# Patient Record
Sex: Male | Born: 2000 | Hispanic: No | Marital: Single
Health system: Southern US, Community
[De-identification: ages and names within clinical notes are randomized; demographics above are authoritative.]

---
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
## 2021-12-03 ENCOUNTER — Encounter: Payer: Self-pay | Admitting: Family Medicine

## 2022-11-24 ENCOUNTER — Other Ambulatory Visit: Payer: Self-pay

## 2022-11-24 ENCOUNTER — Inpatient Hospital Stay (HOSPITAL_BASED_OUTPATIENT_CLINIC_OR_DEPARTMENT_OTHER)
Admission: EM | Admit: 2022-11-24 | Discharge: 2022-11-27 | DRG: 379 | Disposition: A | Discharge status: Home / Self Care | Payer: Medicaid Other | Attending: Internal Medicine | Admitting: Internal Medicine

## 2022-11-24 DIAGNOSIS — K2991 Gastroduodenitis, unspecified, with bleeding: Principal | ICD-10-CM | POA: Diagnosis present

## 2022-11-24 DIAGNOSIS — D509 Iron deficiency anemia, unspecified: Principal | ICD-10-CM

## 2022-11-24 DIAGNOSIS — Z880 Allergy status to penicillin: Secondary | ICD-10-CM

## 2022-11-24 DIAGNOSIS — D500 Iron deficiency anemia secondary to blood loss (chronic): Secondary | ICD-10-CM | POA: Diagnosis present

## 2022-11-24 DIAGNOSIS — D649 Anemia, unspecified: Secondary | ICD-10-CM | POA: Diagnosis present

## 2022-11-24 LAB — CBC WITH DIFFERENTIAL/PLATELET
Abs Immature Granulocytes: 0.01 10*3/uL (ref 0.00–0.07)
Basophils Absolute: 0 10*3/uL (ref 0.0–0.1)
Basophils Relative: 0 %
Eosinophils Absolute: 0.1 10*3/uL (ref 0.0–0.5)
Eosinophils Relative: 2 %
HCT: 22.9 % — ABNORMAL LOW (ref 39.0–52.0)
Hemoglobin: 5.8 g/dL — CL (ref 13.0–17.0)
Immature Granulocytes: 0 %
Lymphocytes Relative: 25 %
Lymphs Abs: 1.3 10*3/uL (ref 0.7–4.0)
MCH: 16.2 pg — ABNORMAL LOW (ref 26.0–34.0)
MCHC: 25.3 g/dL — ABNORMAL LOW (ref 30.0–36.0)
MCV: 64 fL — ABNORMAL LOW (ref 80.0–100.0)
Monocytes Absolute: 0.4 10*3/uL (ref 0.1–1.0)
Monocytes Relative: 7 %
Neutro Abs: 3.3 10*3/uL (ref 1.7–7.7)
Neutrophils Relative %: 66 %
Platelets: 292 10*3/uL (ref 150–400)
RBC: 3.58 MIL/uL — ABNORMAL LOW (ref 4.22–5.81)
RDW: 21.8 % — ABNORMAL HIGH (ref 11.5–15.5)
WBC: 5 10*3/uL (ref 4.0–10.5)
nRBC: 0 % (ref 0.0–0.2)

## 2022-11-24 LAB — COMPREHENSIVE METABOLIC PANEL
ALT: 8 U/L (ref 0–44)
AST: 13 U/L — ABNORMAL LOW (ref 15–41)
Albumin: 4.7 g/dL (ref 3.5–5.0)
Alkaline Phosphatase: 39 U/L (ref 38–126)
Anion gap: 10 (ref 5–15)
BUN: 14 mg/dL (ref 6–20)
CO2: 24 mmol/L (ref 22–32)
Calcium: 8.9 mg/dL (ref 8.9–10.3)
Chloride: 104 mmol/L (ref 98–111)
Creatinine, Ser: 0.89 mg/dL (ref 0.61–1.24)
GFR, Estimated: >60 mL/min (ref >60)
Glucose, Bld: 106 mg/dL — ABNORMAL HIGH (ref 70–99)
Potassium: 3.5 mmol/L (ref 3.5–5.1)
Sodium: 138 mmol/L (ref 135–145)
Total Bilirubin: 0.7 mg/dL (ref 0.3–1.2)
Total Protein: 7.2 g/dL (ref 6.5–8.1)

## 2022-11-24 LAB — FOLATE: Folate: 7.6 ng/mL (ref >5.9)

## 2022-11-24 LAB — RETICULOCYTES
Immature Retic Fract: 20.3 % — ABNORMAL HIGH (ref 2.3–15.9)
RBC.: 3.62 MIL/uL — ABNORMAL LOW (ref 4.22–5.81)
Retic Count, Absolute: 84.7 10*3/uL (ref 19.0–186.0)
Retic Ct Pct: 2.3 % (ref 0.4–3.1)

## 2022-11-24 LAB — IRON AND TIBC
Iron: 15 ug/dL — ABNORMAL LOW (ref 45–182)
Saturation Ratios: 3 % — ABNORMAL LOW (ref 17.9–39.5)
TIBC: 461 ug/dL — ABNORMAL HIGH (ref 250–450)
UIBC: 446 ug/dL

## 2022-11-24 LAB — FERRITIN: Ferritin: 2 ng/mL — ABNORMAL LOW (ref 24–336)

## 2022-11-24 LAB — ABO/RH: ABO/RH(D): O POS

## 2022-11-24 LAB — OCCULT BLOOD X 1 CARD TO LAB, STOOL: Fecal Occult Bld: NEGATIVE

## 2022-11-24 LAB — PREPARE RBC (CROSSMATCH)

## 2022-11-24 LAB — VITAMIN B12: Vitamin B-12: 274 pg/mL (ref 180–914)

## 2022-11-24 MED ORDER — ACETAMINOPHEN 650 MG RE SUPP: 650.0000 mg | Freq: Four times a day (QID) | RECTAL | Status: DC | PRN

## 2022-11-24 MED ORDER — SENNOSIDES-DOCUSATE SODIUM 8.6-50 MG PO TABS: 1.0000 | ORAL_TABLET | Freq: Every evening | ORAL | Status: DC | PRN

## 2022-11-24 MED ORDER — ONDANSETRON HCL 4 MG PO TABS: 4.0000 mg | ORAL_TABLET | Freq: Four times a day (QID) | ORAL | Status: DC | PRN

## 2022-11-24 MED ORDER — ONDANSETRON HCL 4 MG/2ML IJ SOLN: 4.0000 mg | Freq: Four times a day (QID) | INTRAMUSCULAR | Status: DC | PRN

## 2022-11-24 MED ORDER — SODIUM CHLORIDE 0.9% IV SOLUTION: Freq: Once | INTRAVENOUS | Status: AC

## 2022-11-24 MED ORDER — ACETAMINOPHEN 325 MG PO TABS: 650.0000 mg | ORAL_TABLET | Freq: Four times a day (QID) | ORAL | Status: DC | PRN

## 2022-11-24 NOTE — ED Provider Notes (Signed)
[place] EMERGENCY DEPARTMENT AT [facility] Provider Note   CSN: [id number] Arrival date & time: 11/24/22  1703     History  No chief complaint on file.   Shane Fisher is a 22 y.o. male.  HPI Patient otherwise healthy.  Has been feeling fatigued.  Since summer has had lightheadedness.  Went to new PCP today and sent in for hemoglobin of 5.9.  Denies blood in stool.  No chest pain.  States he is lost around 40 pounds.  States he has not been trying to lose weight.   No past medical history on file.  Home Medications Prior to Admission medications   Not on File      Allergies    Amoxil [amoxicillin]    Review of Systems   Review of Systems  Physical Exam Updated Vital Signs BP 136/67 (BP Location: Left Arm)   Pulse 99   Temp 99.5 F (37.5 C) (Oral)   Resp 18   Ht 5\' 7"  (1.702 m)   Wt 76.2 kg   SpO2 100%   BMI 26.31 kg/m  Physical Exam Vitals and nursing note reviewed.  Eyes:     Pupils: Pupils are equal, round, and reactive to light.  Cardiovascular:     Rate and Rhythm: Regular rhythm.     Comments: tachycardia after ambulating. Abdominal:     Palpations: There is no mass.  Musculoskeletal:        General: No tenderness.  Skin:    Coloration: Skin is pale.  Neurological:     Mental Status: He is alert and oriented to person, place, and time.     ED Results / Procedures / Treatments   Labs (all labs ordered are listed, but only abnormal results are displayed) Labs Reviewed  CBC WITH DIFFERENTIAL/PLATELET - Abnormal; Notable for the following components:      Result Value   RBC 3.58 (*)    Hemoglobin 5.8 (*)    HCT 22.9 (*)    MCV 64.0 (*)    MCH 16.2 (*)    MCHC 25.3 (*)    RDW 21.8 (*)    All other components within normal limits  IRON AND TIBC - Abnormal; Notable for the following components:   Iron 15 (*)    TIBC 461 (*)    Saturation Ratios 3 (*)    All other components within normal limits  FERRITIN - Abnormal; Notable  for the following components:   Ferritin 2 (*)    All other components within normal limits  RETICULOCYTES - Abnormal; Notable for the following components:   RBC. 3.62 (*)    Immature Retic Fract 20.3 (*)    All other components within normal limits  COMPREHENSIVE METABOLIC PANEL - Abnormal; Notable for the following components:   Glucose, Bld 106 (*)    AST 13 (*)    All other components within normal limits  VITAMIN B12  OCCULT BLOOD X 1 CARD TO LAB, STOOL  FOLATE  HIV ANTIBODY (ROUTINE TESTING W REFLEX)  CBC  BASIC METABOLIC PANEL  TYPE AND SCREEN  PREPARE RBC (CROSSMATCH)  ABO/RH    EKG None  Radiology No results found.  Procedures Procedures    Medications Ordered in ED Medications  acetaminophen (TYLENOL) tablet 650 mg (has no administration in time range)    Or  acetaminophen (TYLENOL) suppository 650 mg (has no administration in time range)  ondansetron (ZOFRAN) tablet 4 mg (has no administration in time range)    Or  ondansetron (ZOFRAN) injection 4 mg (has no administration in time range)  senna-docusate (Senokot-S) tablet 1 tablet (has no administration in time range)  0.9 %  sodium chloride infusion (Manually program via Guardrails IV Fluids) (has no administration in time range)    ED Course/ Medical Decision Making/ A&P                                 Medical Decision Making Amount and/or Complexity of Data Reviewed Labs: ordered.  Risk Decision regarding hospitalization.   Patient presents with shortness of breath and reported anemia.  Had hemoglobin of 5.9.  Differential diagnoses long but includes causes such as GI bleed.  Will check Hemoccult.  Will recheck blood work including anemia panel.  Likely require admission to hospital.  With weight loss there is worry for malignancy.  Hemoglobin is 5.8.  Microcytic and does have a low ferritin.  Most likely iron deficient.  Discussed with Dr. Cherly Hensen from oncology.  Does not need a specific  hematologic workup at this point but will need iron supplementation and likely blood.  Iron and TIBC still pending.  Hemoccult was negative.  Brown stool.  Will discuss with hospitalist for admission.  There is only emergency release O- blood here and do not think we need emergent transfusion.  CRITICAL CARE Performed by: Benjiman Core Total critical care time: 30 minutes Critical care time was exclusive of separately billable procedures and treating other patients. Critical care was necessary to treat or prevent imminent or life-threatening deterioration. Critical care was time spent personally by me on the following activities: development of treatment plan with patient and/or surrogate as well as nursing, discussions with consultants, evaluation of patient's response to treatment, examination of patient, obtaining history from patient or surrogate, ordering and performing treatments and interventions, ordering and review of laboratory studies, ordering and review of radiographic studies, pulse oximetry and re-evaluation of patient's condition.          Final Clinical Impression(s) / ED Diagnoses Final diagnoses:  Microcytic anemia    Rx / DC Orders ED Discharge Orders     None         Benjiman Core, MD 11/24/22 2318

## 2022-11-24 NOTE — H&P (Signed)
History and Physical    Shane Fisher:[id number] DOB: 2000/08/01 DOA: 11/24/2022  PCP: Berline Lopes, MD  Patient coming from: Home  I have personally briefly reviewed patient's old medical records in [facility]  Chief Complaint: Anemia  HPI: Shane Fisher is a 22 y.o. male with no significant medical history who presented to the ED for evaluation of anemia.  Patient states that since this summer he has been feeling generally fatigued.  He has had some lightheadedness but has not fallen or lost consciousness.  He states that he has been having night sweats and has had an unintentional 40 pound weight loss since summer 2023.  He was seen by his new PCP today.  Blood work was obtained and he was found to have hemoglobin of 5.9.  Patient was advised to present to the ED for further evaluation.  Patient has not seen any obvious bleeding including hematemesis, hemoptysis, hematochezia, or melena.  He does not take any blood thinners or NSAIDs.  He has no known chronic medical conditions.  He has not had any abdominal pain, nausea, vomiting, chest pain, dyspnea.  [facility] ED Course  Labs/Imaging on admission: I have personally reviewed following labs and imaging studies.  Initial vitals showed BP 115/60, pulse 82, RR 15, temp 97.9 F, SpO2 100% on room air.  Labs showed hemoglobin 5.8, hematocrit 22.9, MCV 64.0, platelets 292,000, WBC 5.0, ferritin 2, sodium 138, potassium 3.5, bicarb 24, BUN 14, creatinine 0.89, serum glucose 106, AST 13, ALT 8, alk phos 39, total bilirubin 0.7.  FOBT was negative.  B12, iron/TIBC, folate levels in process.  The hospitalist service was consulted to admit for further evaluation and management.  Review of Systems: All systems reviewed and are negative except as documented in history of present illness above.   No past medical history on file.  No past surgical history on file.  Social History:  has no history on file for  tobacco use, alcohol use, and drug use.  Allergies  Allergen Reactions   Amoxil [Amoxicillin]     No family history on file.   Prior to Admission medications   Not on File    Physical Exam: Vitals:   11/24/22 1745 11/24/22 1830 11/24/22 2000 11/24/22 2106  BP: 115/60 (!) 113/59 112/65 136/67  Pulse: 80 82 96 99  Resp: 15 18 20 18   Temp:    99.5 F (37.5 C)  TempSrc:    Oral  SpO2: 100% 100% 100% 100%  Weight:      Height:       Constitutional: Sitting up in bed.  NAD, calm, comfortable Eyes: EOMI, lids and conjunctivae normal ENMT: Mucous membranes are moist. Posterior pharynx clear of any exudate or lesions.Normal dentition.  Neck: normal, supple, no masses. Respiratory: clear to auscultation bilaterally, no wheezing, no crackles. Normal respiratory effort. No accessory muscle use.  Cardiovascular: Regular rate and rhythm, no murmurs / rubs / gallops. No extremity edema. 2+ pedal pulses. Abdomen: no tenderness, no masses palpated.  Musculoskeletal: no clubbing / cyanosis. No joint deformity upper and lower extremities. Good ROM, no contractures. Normal muscle tone.  Skin: no rashes, lesions, ulcers. No induration Neurologic: Sensation intact. Strength 5/5 in all 4.  Psychiatric: Normal judgment and insight. Alert and oriented x 3. Normal mood.   EKG: Not performed. Assessment/Plan Principal Problem:   Symptomatic anemia Active Problems:   Iron deficiency anemia   Shane Fisher is a 22 y.o. male with no significant medical  history who is admitted with symptomatic anemia.  Assessment and Plan: Symptomatic anemia with iron deficiency: Presenting with Hgb 5.8, ferritin 2.  Patient has not seen any obvious bleeding, FOBT was negative in the ED.  However he does report an unintentional 40 pound weight loss since summer 2023 as well as night sweats concerning for potential malignancy. -Type and screen -Transfuse 1 unit PRBC -Follow B12, folate, iron/TIBC levels -Repeat  CBC posttransfusion -Consider iron transfusion while in hospital -Secure chat sent to [facility] Dr. Bosie Clos who will notify daytime team for consult tomorrow   DVT prophylaxis: SCDs Start: 11/24/22 2117 Code Status: Full code Family Communication: Parents and sister at bedside Disposition Plan: From home and likely discharge to home pending clinical progress Consults called: None Severity of Illness: The appropriate patient status for this patient is OBSERVATION. Observation status is judged to be reasonable and necessary in order to provide the required intensity of service to ensure the patient's safety. The patient's presenting symptoms, physical exam findings, and initial radiographic and laboratory data in the context of their medical condition is felt to place them at decreased risk for further clinical deterioration. Furthermore, it is anticipated that the patient will be medically stable for discharge from the hospital within 2 midnights of admission.   Darreld Mclean MD [facility]  If 7PM-7AM, please contact night-coverage [email or web address]  11/24/2022, 9:37 PM

## 2022-11-24 NOTE — ED Triage Notes (Signed)
Seen at PCP this AM for check-up. Found to have HGB 5.9- sent here. 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 Denies obvious routes of bleeding- unsure of loss. Ambulatory to triage, A+Ox4, pale.   No daily meds.

## 2022-11-24 NOTE — Hospital Course (Signed)
Shane Fisher is a 22 y.o. male with no significant medical history who is admitted with symptomatic anemia.

## 2022-11-24 NOTE — Progress Notes (Signed)
Plan of Care Note for accepted transfer   Patient: Shane Fisher MRN: [id number]   DOA: 11/24/2022  Facility requesting transfer: [facility]   Requesting Provider: Dr. Rubin Payor   Reason for transfer: Symptomatic anemia   Facility course: 22 year old gentleman who denies any significant past medical history presents with progressive fatigue and unintentional weight loss over the course of months.  He is afebrile with stable vitals.  Labs are most notable for hemoglobin 5.8 with MCV 64.  WBC, platelet count, and bilirubin are normal and FOBT is negative.  Ferritin is 2 and remainder of anemia panel is pending.  Plan of care: The patient is accepted for admission to Med-surg  unit, at [facility].   Author: Briscoe Deutscher, MD 11/24/2022  Check [email or web address] for on-call coverage.  Nursing staff, Please call [facility] Admits & Consults System-Wide number on Amion as soon as patient's arrival, so appropriate admitting provider can evaluate the pt.

## 2022-11-25 ENCOUNTER — Encounter (HOSPITAL_COMMUNITY): Payer: Self-pay | Admitting: Family Medicine

## 2022-11-25 ENCOUNTER — Inpatient Hospital Stay (HOSPITAL_COMMUNITY): Payer: Medicaid Other

## 2022-11-25 DIAGNOSIS — D500 Iron deficiency anemia secondary to blood loss (chronic): Secondary | ICD-10-CM | POA: Diagnosis present

## 2022-11-25 DIAGNOSIS — Z880 Allergy status to penicillin: Secondary | ICD-10-CM | POA: Diagnosis not present

## 2022-11-25 DIAGNOSIS — D509 Iron deficiency anemia, unspecified: Secondary | ICD-10-CM | POA: Diagnosis present

## 2022-11-25 DIAGNOSIS — K2991 Gastroduodenitis, unspecified, with bleeding: Secondary | ICD-10-CM | POA: Diagnosis present

## 2022-11-25 LAB — CBC
HCT: 25.1 % — ABNORMAL LOW (ref 39.0–52.0)
Hemoglobin: 6.7 g/dL — CL (ref 13.0–17.0)
MCH: 18.2 pg — ABNORMAL LOW (ref 26.0–34.0)
MCHC: 26.7 g/dL — ABNORMAL LOW (ref 30.0–36.0)
MCV: 68.2 fL — ABNORMAL LOW (ref 80.0–100.0)
Platelets: 268 10*3/uL (ref 150–400)
RBC: 3.68 MIL/uL — ABNORMAL LOW (ref 4.22–5.81)
RDW: 24.6 % — ABNORMAL HIGH (ref 11.5–15.5)
WBC: 4.9 10*3/uL (ref 4.0–10.5)
nRBC: 0 % (ref 0.0–0.2)

## 2022-11-25 LAB — HEMOGLOBIN AND HEMATOCRIT, BLOOD
HCT: 30.1 % — ABNORMAL LOW (ref 39.0–52.0)
Hemoglobin: 8.2 g/dL — ABNORMAL LOW (ref 13.0–17.0)

## 2022-11-25 LAB — HIV ANTIBODY (ROUTINE TESTING W REFLEX): HIV Screen 4th Generation wRfx: NONREACTIVE

## 2022-11-25 LAB — BASIC METABOLIC PANEL
Anion gap: 7 (ref 5–15)
BUN: 13 mg/dL (ref 6–20)
CO2: 27 mmol/L (ref 22–32)
Calcium: 8.8 mg/dL — ABNORMAL LOW (ref 8.9–10.3)
Chloride: 104 mmol/L (ref 98–111)
Creatinine, Ser: 0.83 mg/dL (ref 0.61–1.24)
GFR, Estimated: >60 mL/min (ref >60)
Glucose, Bld: 103 mg/dL — ABNORMAL HIGH (ref 70–99)
Potassium: 3.8 mmol/L (ref 3.5–5.1)
Sodium: 138 mmol/L (ref 135–145)

## 2022-11-25 LAB — PREPARE RBC (CROSSMATCH)

## 2022-11-25 MED ORDER — SODIUM CHLORIDE 0.9% IV SOLUTION: Freq: Once | INTRAVENOUS | Status: AC

## 2022-11-25 MED ORDER — SODIUM CHLORIDE 0.9% IV SOLUTION: Freq: Once | INTRAVENOUS | Status: DC

## 2022-11-25 MED ORDER — IOHEXOL 9 MG/ML PO SOLN
500.0000 mL | ORAL | Status: AC
  Administered 2022-11-25 (×2): 500 mL via ORAL

## 2022-11-25 MED ORDER — IOHEXOL 300 MG/ML  SOLN
100.0000 mL | Freq: Once | INTRAMUSCULAR | Status: AC | PRN
  Administered 2022-11-25: 100 mL via INTRAVENOUS

## 2022-11-25 MED ORDER — SODIUM CHLORIDE 0.9 % IV SOLN
100.0000 mg | Freq: Once | INTRAVENOUS | Status: AC
  Administered 2022-11-25: 100 mg via INTRAVENOUS
  Filled 2022-11-25: qty 5

## 2022-11-25 NOTE — Consult Note (Signed)
[facility] Gastroenterology Consult  Referring Provider: No ref. provider found Primary Care Physician:  Berline Lopes, MD Primary Gastroenterologist: Gentry Fitz  Reason for Consultation: Profound anemia  SUBJECTIVE:   HPI: Shane Fisher is a 22 y.o. male with no significant medical history presented to hospital after evaluation by PCP showed anemia. He noted that he has lost 40 lb unintentionally since January 2024. He has been experiencing reduced appetite and some nausea with intermittent vomiting. No constipation or diarrhea. No joint pains. No chest pain or shortness of breath. No prior surgeries. No family history of GI concerns. Denied alcohol use, tobacco use, marijuana use.   Hgb on presentation 5.8, now 6.7, PLT 268, WBC 4.9. Iron deficient. BUN/Cr 13/0.83, Na 138, K 3.8, AST/ALT 13/8, ALP 39, Tb 0.7. Fecal occult blood testing negative. No imaging obtained.   History reviewed. No pertinent past medical history. History reviewed. No pertinent surgical history. Prior to Admission medications   Not on File   Current Facility-Administered Medications  Medication Dose Route Frequency Provider Last Rate Last Admin   0.9 %  sodium chloride infusion (Manually program via Guardrails IV Fluids)   Intravenous Once Dorcas Carrow, MD       acetaminophen (TYLENOL) tablet 650 mg  650 mg Oral Q6H PRN Charlsie Quest, MD       Or   acetaminophen (TYLENOL) suppository 650 mg  650 mg Rectal Q6H PRN Charlsie Quest, MD       ondansetron (ZOFRAN) tablet 4 mg  4 mg Oral Q6H PRN Charlsie Quest, MD       Or   ondansetron (ZOFRAN) injection 4 mg  4 mg Intravenous Q6H PRN Charlsie Quest, MD       senna-docusate (Senokot-S) tablet 1 tablet  1 tablet Oral QHS PRN Charlsie Quest, MD       Allergies as of 11/24/2022 - Reviewed 11/24/2022  Allergen Reaction Noted   Amoxil [amoxicillin]  07/24/2014   History reviewed. No pertinent family history. Social History   Socioeconomic History   Marital  status: Single    Spouse name: Not on file   Number of children: Not on file   Years of education: Not on file   Highest education level: Not on file  Occupational History   Not on file  Tobacco Use   Smoking status: Unknown   Smokeless tobacco: Not on file  Substance and Sexual Activity   Alcohol use: Not Currently   Drug use: Not Currently   Sexual activity: Not Currently  Other Topics Concern   Not on file  Social History Narrative   Not on file   Social Determinants of Health   Financial Resource Strain: Not on file  Food Insecurity: No Food Insecurity (11/24/2022)   Hunger Vital Sign    Worried About Running Out of Food in the Last Year: Never true    Ran Out of Food in the Last Year: Never true  Transportation Needs: No Transportation Needs (11/24/2022)   PRAPARE - Administrator, Civil Service (Medical): No    Lack of Transportation (Non-Medical): No  Physical Activity: Not on file  Stress: Not on file  Social Connections: Not on file  Intimate Partner Violence: Not At Risk (11/24/2022)   Humiliation, Afraid, Rape, and Kick questionnaire    Fear of Current or Ex-Partner: No    Emotionally Abused: No    Physically Abused: No    Sexually Abused: No   Review of Systems:  Review  of Systems  Constitutional:  Positive for malaise/fatigue and weight loss.  Respiratory:  Negative for shortness of breath.   Cardiovascular:  Negative for chest pain.  Gastrointestinal:  Positive for nausea and vomiting. Negative for abdominal pain, blood in stool, constipation and diarrhea.    OBJECTIVE:   Temp:  [97.9 F (36.6 C)-99.5 F (37.5 C)] 98.5 F (36.9 C) (10/15 1038) Pulse Rate:  [77-99] 77 (10/15 1038) Resp:  [15-20] 20 (10/15 1038) BP: (105-136)/(48-67) 115/57 (10/15 1038) SpO2:  [100 %] 100 % (10/15 1038) Weight:  [76.2 kg] 76.2 kg (10/14 1722) Last BM Date : 11/24/22 Physical Exam Constitutional:      General: He is not in acute distress.     Appearance: He is not ill-appearing, toxic-appearing or diaphoretic.  Cardiovascular:     Rate and Rhythm: Normal rate and regular rhythm.  Pulmonary:     Effort: No respiratory distress.     Breath sounds: Normal breath sounds.  Abdominal:     General: Bowel sounds are normal. There is no distension.     Palpations: Abdomen is soft.     Tenderness: There is no abdominal tenderness. There is no guarding.  Musculoskeletal:     Right lower leg: No edema.     Left lower leg: No edema.  Skin:    General: Skin is warm and dry.  Neurological:     Mental Status: He is alert.     Labs: Recent Labs    11/24/22 1720 11/25/22 0539  WBC 5.0 4.9  HGB 5.8* 6.7*  HCT 22.9* 25.1*  PLT 292 268   BMET Recent Labs    11/24/22 1730 11/25/22 0539  NA 138 138  K 3.5 3.8  CL 104 104  CO2 24 27  GLUCOSE 106* 103*  BUN 14 13  CREATININE 0.89 0.83  CALCIUM 8.9 8.8*   LFT Recent Labs    11/24/22 1730  PROT 7.2  ALBUMIN 4.7  AST 13*  ALT 8  ALKPHOS 39  BILITOT 0.7   PT/INR No results for input(s): "LABPROT", "INR" in the last 72 hours.  Diagnostic imaging: No results found.  IMPRESSION: Iron deficiency anemia  -Fecal occult negative stool Unintentional weight loss Nausea, intermittent emesis  PLAN: -Recommend eventual EGD and colonoscopy to evaluate profound anemia, unintentional weight loss, nausea and vomiting, discussed risks of bleeding/infection/perforation/missed lesion/anesthesia, he verbalized understanding and elected to proceed -No availability to complete procedures until 11/27/22, patient agreeable to staying inpatient for procedures -Would recommend CT imaging of chest/abdomen and pelvis while waiting for procedures -Ok for regular diet today, clear liquid diet tomorrow and bowel preparation tomorrow -Trend H/H, transfuse for Hgb < 7  -[facility] will follow   LOS: 0 days   Liliane Shi, DO [facility]enterology

## 2022-11-25 NOTE — Progress Notes (Signed)
PROGRESS NOTE    Shane Fisher  ZOX:[id number] DOB: 03/12/2000 DOA: 11/24/2022 PCP: Berline Lopes, MD    Brief Narrative:  22 year old with no significant medical history presented with feeling fatigue and occasional lightheadedness at least since last few months, 40 pound weight loss since summer 2023.  Does have occasional night sweats but no other symptoms.  No clinical history of bleeding anywhere.  No baseline labs available in the past.  Admitted with hemoglobin of 5.9 otherwise medically stable. Born and raised in [place]. He had a short visit to [place] in December 2023, denies any signs of infection since then.   Assessment & Plan:   Acute on chronic iron deficiency anemia, symptomatic anemia: Patient with night sweats, weight loss, fatigue and weakness.  Hemoglobin 5.9.  Ferritin 2.  B12 and folic acid normal. Receiving second unit of PRBC today, will also transfuse IV iron.  Currently no evidence of active bleeding. GI consulted, anticipating EGD and colonoscopy.  If unrevealing, patient will need CT scan of the chest abdomen pelvis with contrast to rule out any occult malignancy.  Will hold off oral contrast before endoscopies.     DVT prophylaxis: SCDs Start: 11/24/22 2117   Code Status: Full code Family Communication: Father at the bedside Disposition Plan: Status is: Observation The patient will require care spanning > 2 midnights and should be moved to inpatient because: Significant low hemoglobin, blood transfusions and inpatient procedures     Consultants:  Gastroenterology  Procedures:  None  Antimicrobials:  None   Subjective: Patient seen and examined.  No overnight events.  Patient himself denies any complaints.  Tolerating blood transfusion without any issues.  Father at the bedside.  Hungry.  Objective: Vitals:   11/25/22 0643 11/25/22 0805 11/25/22 0826 11/25/22 1038  BP: 110/64 (!) 121/53 (!) 105/48 (!) 115/57  Pulse: 97 82 77 77   Resp: 18 20 20 20   Temp: 98.5 F (36.9 C) 98.1 F (36.7 C) 98.1 F (36.7 C) 98.5 F (36.9 C)  TempSrc: Oral Oral Oral Oral  SpO2: 100% 100% 100% 100%  Weight:      Height:        Intake/Output Summary (Last 24 hours) at 11/25/2022 1115 Last data filed at 11/25/2022 1037 Gross per 24 hour  Intake 964.5 ml  Output --  Net 964.5 ml   Filed Weights   11/24/22 1722  Weight: 76.2 kg    Examination:  General exam: Appears calm and comfortable . Pale  Respiratory system: Clear to auscultation. Respiratory effort normal. Cardiovascular system: S1 & S2 heard, RRR. No JVD, murmurs, rubs, gallops or clicks. No pedal edema. Gastrointestinal system: Abdomen is nondistended, soft and nontender. No organomegaly or masses felt. Normal bowel sounds heard. Central nervous system: Alert and oriented. No focal neurological deficits. Extremities: Symmetric 5 x 5 power. Skin: No rashes, lesions or ulcers Psychiatry: Judgement and insight appear normal. Mood & affect appropriate.     Data Reviewed: I have personally reviewed following labs and imaging studies  CBC: Recent Labs  Lab 11/24/22 1720 11/25/22 0539  WBC 5.0 4.9  NEUTROABS 3.3  --   HGB 5.8* 6.7*  HCT 22.9* 25.1*  MCV 64.0* 68.2*  PLT 292 268   Basic Metabolic Panel: Recent Labs  Lab 11/24/22 1730 11/25/22 0539  NA 138 138  K 3.5 3.8  CL 104 104  CO2 24 27  GLUCOSE 106* 103*  BUN 14 13  CREATININE 0.89 0.83  CALCIUM 8.9 8.8*  GFR: Estimated Creatinine Clearance: 130.5 mL/min (by C-G formula based on SCr of 0.83 mg/dL). Liver Function Tests: Recent Labs  Lab 11/24/22 1730  AST 13*  ALT 8  ALKPHOS 39  BILITOT 0.7  PROT 7.2  ALBUMIN 4.7   No results for input(s): "LIPASE", "AMYLASE" in the last 168 hours. No results for input(s): "AMMONIA" in the last 168 hours. Coagulation Profile: No results for input(s): "INR", "PROTIME" in the last 168 hours. Cardiac Enzymes: No results for input(s): "CKTOTAL",  "CKMB", "CKMBINDEX", "TROPONINI" in the last 168 hours. BNP (last 3 results) No results for input(s): "PROBNP" in the last 8760 hours. HbA1C: No results for input(s): "HGBA1C" in the last 72 hours. CBG: No results for input(s): "GLUCAP" in the last 168 hours. Lipid Profile: No results for input(s): "CHOL", "HDL", "LDLCALC", "TRIG", "CHOLHDL", "LDLDIRECT" in the last 72 hours. Thyroid Function Tests: No results for input(s): "TSH", "T4TOTAL", "FREET4", "T3FREE", "THYROIDAB" in the last 72 hours. Anemia Panel: Recent Labs    11/24/22 1720 11/24/22 1730  VITAMINB12  --  274  FOLATE  --  7.6  FERRITIN  --  2*  TIBC 461*  --   IRON 15*  --   RETICCTPCT  --  2.3   Sepsis Labs: No results for input(s): "PROCALCITON", "LATICACIDVEN" in the last 168 hours.  No results found for this or any previous visit (from the past 240 hour(s)).       Radiology Studies: No results found.      Scheduled Meds:  sodium chloride   Intravenous Once   Continuous Infusions:   LOS: 0 days    Time spent: 35 minutes    Dorcas Carrow, MD [facility]

## 2022-11-26 DIAGNOSIS — D649 Anemia, unspecified: Secondary | ICD-10-CM | POA: Diagnosis present

## 2022-11-26 DIAGNOSIS — D500 Iron deficiency anemia secondary to blood loss (chronic): Secondary | ICD-10-CM

## 2022-11-26 LAB — CBC
HCT: 29.7 % — ABNORMAL LOW (ref 39.0–52.0)
Hemoglobin: 8 g/dL — ABNORMAL LOW (ref 13.0–17.0)
MCH: 19 pg — ABNORMAL LOW (ref 26.0–34.0)
MCHC: 26.9 g/dL — ABNORMAL LOW (ref 30.0–36.0)
MCV: 70.7 fL — ABNORMAL LOW (ref 80.0–100.0)
Platelets: 262 10*3/uL (ref 150–400)
RBC: 4.2 MIL/uL — ABNORMAL LOW (ref 4.22–5.81)
RDW: 25 % — ABNORMAL HIGH (ref 11.5–15.5)
WBC: 4 10*3/uL (ref 4.0–10.5)
nRBC: 0 % (ref 0.0–0.2)

## 2022-11-26 LAB — TYPE AND SCREEN
ABO/RH(D): O POS
Antibody Screen: NEGATIVE
Unit division: 0
Unit division: 0

## 2022-11-26 LAB — BPAM RBC
Blood Product Expiration Date: 202411122359
Blood Product Expiration Date: 202411142359
ISSUE DATE / TIME: 202410142351
ISSUE DATE / TIME: 202410150805
Unit Type and Rh: 5100
Unit Type and Rh: 5100

## 2022-11-26 LAB — PROTIME-INR
INR: 1.2 (ref 0.8–1.2)
Prothrombin Time: 15.2 s (ref 11.4–15.2)

## 2022-11-26 LAB — APTT: aPTT: 29 s (ref 24–36)

## 2022-11-26 MED ORDER — BISACODYL 5 MG PO TBEC
5.0000 mg | DELAYED_RELEASE_TABLET | Freq: Once | ORAL | Status: AC
  Administered 2022-11-26: 5 mg via ORAL
  Filled 2022-11-26: qty 1

## 2022-11-26 MED ORDER — PEG 3350-KCL-NA BICARB-NACL 420 G PO SOLR
4000.0000 mL | Freq: Once | ORAL | Status: AC
  Administered 2022-11-26: 4000 mL via ORAL
  Filled 2022-11-26: qty 4000

## 2022-11-26 NOTE — Progress Notes (Signed)
PROGRESS NOTE   Shane Fisher  MWU:[id number] DOB: 2000/05/26 DOA: 11/24/2022 PCP: Berline Lopes, MD   Date of Service: the patient was seen and examined on 11/26/2022  Brief Narrative:  Shane Fisher is a 22 y.o. male with no significant medical history who is admitted with complaints of fatigue and lightheadedness found to have a hemoglobin of 5.9 by their primary care provider sent to [facility] hospital emergency department for further evaluation.  Upon evaluation in the emergency department confirmation blood testing revealed a hemoglobin of 5.8.  Patient was hemodynamically stable.  Patient was accepted for transfer to [facility] for further medical care.  Upon arrival to [facility] 1 unit packed blood cell transfusion was initiated.  Secure chat consultation request was sent to gastroenterology to request their assistance which is greatly appreciated.   Assessment and Plan: * Chronic blood loss anemia Minimal symptoms suggests chronic development of severe anemia Hemoglobin has improved substantially, up to most recent value of 8.0 from 5.8 on arrival after only 1 unit of packed red blood cells transfused. Continuing monitor hemoglobin and hematocrit with serial CBCs. [facility] following, their input is appreciated Patient to undergo upper and lower endoscopic workup on 10/17 Bowel prep and clear liquid diet today with n.p.o. after midnight.  Symptomatic anemia See assessment and plan above  Iron deficiency anemia due to chronic blood loss Iron panel consistent with iron deficiency anemia further suggesting that chronic blood loss is the cause of the patient's presentation. Folate and vitamin B12 levels unremarkable    Subjective:  Patient states that his generalized weakness and lightheadedness have substantially improved since arrival.  Patient states that his symptoms have improved status post transfusion.  Patient denies any abdominal pain  Tally blood in his stool or melena today.  Physical Exam:  Vitals:   11/25/22 1441 11/25/22 2041 11/26/22 0556 11/26/22 1307  BP: (!) 106/50 (!) 121/58 (!) 102/55 122/61  Pulse: 73 80 88 79  Resp: 18 18 14 16   Temp: (!) 97.5 F (36.4 C) 98.3 F (36.8 C) 98.1 F (36.7 C) 98.3 F (36.8 C)  TempSrc: Oral Oral Oral Oral  SpO2: 100% 100% 100% 100%  Weight:      Height:        Constitutional: Awake alert and oriented x3, no associated distress.   Skin: no rashes, no lesions, good skin turgor noted. Eyes: Pupils are equally reactive to light.  No evidence of scleral icterus or conjunctival pallor.  ENMT: Moist mucous membranes noted.  Posterior pharynx clear of any exudate or lesions.   Respiratory: clear to auscultation bilaterally, no wheezing, no crackles. Normal respiratory effort. No accessory muscle use.  Cardiovascular: Regular rate and rhythm, no murmurs / rubs / gallops. No extremity edema. 2+ pedal pulses. No carotid bruits.  Abdomen: Abdomen is soft and nontender.  No evidence of intra-abdominal masses.  Positive bowel sounds noted in all quadrants.   Musculoskeletal: No joint deformity upper and lower extremities. Good ROM, no contractures. Normal muscle tone.    Data Reviewed:  I have personally reviewed and interpreted labs, imaging.  Significant findings are   CBC: Recent Labs  Lab 11/24/22 1720 11/25/22 0539 11/25/22 1505 11/26/22 1346  WBC 5.0 4.9  --  4.0  NEUTROABS 3.3  --   --   --   HGB 5.8* 6.7* 8.2* 8.0*  HCT 22.9* 25.1* 30.1* 29.7*  MCV 64.0* 68.2*  --  70.7*  PLT 292 268  --  262  Basic Metabolic Panel: Recent Labs  Lab 11/24/22 1730 11/25/22 0539  NA 138 138  K 3.5 3.8  CL 104 104  CO2 24 27  GLUCOSE 106* 103*  BUN 14 13  CREATININE 0.89 0.83  CALCIUM 8.9 8.8*   GFR: Estimated Creatinine Clearance: 130.5 mL/min (by C-G formula based on SCr of 0.83 mg/dL). Liver Function Tests: Recent Labs  Lab 11/24/22 1730  AST 13*  ALT 8   ALKPHOS 39  BILITOT 0.7  PROT 7.2  ALBUMIN 4.7    Coagulation Profile: Recent Labs  Lab 11/26/22 1346  INR 1.2     Code Status:  Full code.  Code status decision has been confirmed with: patient Family Communication: Mother at the bedside who has been updated on plan of care.   Severity of Illness:  The appropriate patient status for this patient is INPATIENT. Inpatient status is judged to be reasonable and necessary in order to provide the required intensity of service to ensure the patient's safety. The patient's presenting symptoms, physical exam findings, and initial radiographic and laboratory data in the context of their chronic comorbidities is felt to place them at high risk for further clinical deterioration. Furthermore, it is not anticipated that the patient will be medically stable for discharge from the hospital within 2 midnights of admission.   * I certify that at the point of admission it is my clinical judgment that the patient will require inpatient hospital care spanning beyond 2 midnights from the point of admission due to high intensity of service, high risk for further deterioration and high frequency of surveillance required.*  Time spent:  50 minutes  Author:  Marinda Elk MD  11/26/2022 7:22 PM

## 2022-11-26 NOTE — Assessment & Plan Note (Signed)
Iron panel consistent with iron deficiency anemia further suggesting that chronic blood loss is the cause of the patient's presentation. Folate and vitamin B12 levels unremarkable

## 2022-11-26 NOTE — H&P (View-Only) (Signed)
[facility] Progress Note  SUBJECTIVE:   Interval history: Shane Fisher was seen and evaluated today at bedside.  Resting comfortably in bed, mother at bedside.  Denied abdominal pain.  No chest pain or shortness of breath.  No recent bowel movement.  Agreeable to EGD and colonoscopy tomorrow.  History reviewed. No pertinent past medical history. History reviewed. No pertinent surgical history. Current Facility-Administered Medications  Medication Dose Route Frequency Provider Last Rate Last Admin   0.9 %  sodium chloride infusion (Manually program via Guardrails IV Fluids)   Intravenous Once Dorcas Carrow, MD       acetaminophen (TYLENOL) tablet 650 mg  650 mg Oral Q6H PRN Charlsie Quest, MD       Or   acetaminophen (TYLENOL) suppository 650 mg  650 mg Rectal Q6H PRN Charlsie Quest, MD       bisacodyl (DULCOLAX) EC tablet 5 mg  5 mg Oral Once Liliane Shi H, DO       ondansetron [facility]) tablet 4 mg  4 mg Oral Q6H PRN Charlsie Quest, MD       Or   ondansetron (ZOFRAN) injection 4 mg  4 mg Intravenous Q6H PRN Charlsie Quest, MD       polyethylene glycol-electrolytes (NuLYTELY) solution 4,000 mL  4,000 mL Oral Once Lynann Bologna, DO       senna-docusate (Senokot-S) tablet 1 tablet  1 tablet Oral QHS PRN Charlsie Quest, MD       Allergies as of 11/24/2022 - Reviewed 11/24/2022  Allergen Reaction Noted   Amoxil [amoxicillin]  07/24/2014   Review of Systems:  Review of Systems  Respiratory:  Negative for shortness of breath.   Cardiovascular:  Negative for chest pain.  Gastrointestinal:  Negative for abdominal pain, constipation, diarrhea, nausea and vomiting.    OBJECTIVE:   Temp:  [97.5 F (36.4 C)-98.3 F (36.8 C)] 98.1 F (36.7 C) (10/16 0556) Pulse Rate:  [73-88] 88 (10/16 0556) Resp:  [14-18] 14 (10/16 0556) BP: (102-121)/(50-58) 102/55 (10/16 0556) SpO2:  [100 %] 100 % (10/16 0556) Last BM Date : 11/24/22 Physical Exam Constitutional:       General: He is not in acute distress.    Appearance: He is not ill-appearing, toxic-appearing or diaphoretic.  Cardiovascular:     Rate and Rhythm: Normal rate and regular rhythm.  Pulmonary:     Effort: No respiratory distress.     Breath sounds: Normal breath sounds.  Abdominal:     General: Bowel sounds are normal. There is no distension.     Palpations: Abdomen is soft.     Tenderness: There is no abdominal tenderness. There is no guarding.  Musculoskeletal:     Right lower leg: No edema.     Left lower leg: No edema.  Neurological:     Mental Status: He is alert.     Labs: Recent Labs    11/24/22 1720 11/25/22 0539 11/25/22 1505  WBC 5.0 4.9  --   HGB 5.8* 6.7* 8.2*  HCT 22.9* 25.1* 30.1*  PLT 292 268  --    BMET Recent Labs    11/24/22 1730 11/25/22 0539  NA 138 138  K 3.5 3.8  CL 104 104  CO2 24 27  GLUCOSE 106* 103*  BUN 14 13  CREATININE 0.89 0.83  CALCIUM 8.9 8.8*   LFT Recent Labs    11/24/22 1730  PROT 7.2  ALBUMIN 4.7  AST 13*  ALT 8  ALKPHOS 39  BILITOT 0.7   PT/INR No results for input(s): "LABPROT", "INR" in the last 72 hours. Diagnostic imaging: CT CHEST ABDOMEN PELVIS W CONTRAST  Result Date: 11/25/2022 CLINICAL DATA:  40 lb weight loss, anemia, loss of appetite. Evaluate for occult malignancy. EXAM: CT CHEST, ABDOMEN, AND PELVIS WITH CONTRAST TECHNIQUE: Multidetector CT imaging of the chest, abdomen and pelvis was performed following the standard protocol during bolus administration of intravenous contrast. RADIATION DOSE REDUCTION: This exam was performed according to the departmental dose-optimization program which includes automated exposure control, adjustment of the mA and/or kV according to patient size and/or use of iterative reconstruction technique. CONTRAST:  OMNIPAQUE IOHEXOL 300 MG/ML  SOLN COMPARISON:  None Available. FINDINGS: CT CHEST FINDINGS Cardiovascular: Heart is normal size. Aorta is normal caliber.  Mediastinum/Nodes: No mediastinal, hilar, or axillary adenopathy. Trachea and esophagus are unremarkable. Thyroid unremarkable. Lungs/Pleura: Lungs are clear. No focal airspace opacities or suspicious nodules. No effusions. Musculoskeletal: Chest wall soft tissues are unremarkable. No acute bony abnormality. CT ABDOMEN PELVIS FINDINGS Hepatobiliary: No focal hepatic abnormality. Gallbladder unremarkable. Pancreas: No focal abnormality or ductal dilatation. Spleen: No focal abnormality.  Normal size. Adrenals/Urinary Tract: No adrenal abnormality. No focal renal abnormality. No stones or hydronephrosis. Urinary bladder is unremarkable. Stomach/Bowel: Stomach, large and small bowel grossly unremarkable. Normal appendix. Vascular/Lymphatic: No evidence of aneurysm or adenopathy. Reproductive: No visible focal abnormality. Other: No free fluid or free air. Musculoskeletal: No acute bony abnormality. IMPRESSION: No acute findings or significant abnormality in the chest, abdomen or pelvis. Electronically Signed   By: Charlett Nose M.D.   On: 11/25/2022 20:22    IMPRESSION: Iron deficiency anemia             -Fecal occult negative stool Unintentional weight loss Nausea, intermittent emesis, no current  PLAN: -Clear liquid diet today, bowel preparation this afternoon, n.p.o. at midnight for EGD and colonoscopy tomorrow -Trend H/H, transfuse for hemoglobin less than 7 -Further recommendations to follow pending procedures tomorrow   LOS: 1 day   Liliane Shi, [facility] G[facility]enterology

## 2022-11-26 NOTE — Progress Notes (Signed)
[facility] Progress Note  SUBJECTIVE:   Interval history: Shane Fisher was seen and evaluated today at bedside.  Resting comfortably in bed, mother at bedside.  Denied abdominal pain.  No chest pain or shortness of breath.  No recent bowel movement.  Agreeable to EGD and colonoscopy tomorrow.  History reviewed. No pertinent past medical history. History reviewed. No pertinent surgical history. Current Facility-Administered Medications  Medication Dose Route Frequency Provider Last Rate Last Admin   0.9 %  sodium chloride infusion (Manually program via Guardrails IV Fluids)   Intravenous Once Dorcas Carrow, MD       acetaminophen (TYLENOL) tablet 650 mg  650 mg Oral Q6H PRN Charlsie Quest, MD       Or   acetaminophen (TYLENOL) suppository 650 mg  650 mg Rectal Q6H PRN Charlsie Quest, MD       bisacodyl (DULCOLAX) EC tablet 5 mg  5 mg Oral Once Liliane Shi H, DO       ondansetron [facility]) tablet 4 mg  4 mg Oral Q6H PRN Charlsie Quest, MD       Or   ondansetron (ZOFRAN) injection 4 mg  4 mg Intravenous Q6H PRN Charlsie Quest, MD       polyethylene glycol-electrolytes (NuLYTELY) solution 4,000 mL  4,000 mL Oral Once Lynann Bologna, DO       senna-docusate (Senokot-S) tablet 1 tablet  1 tablet Oral QHS PRN Charlsie Quest, MD       Allergies as of 11/24/2022 - Reviewed 11/24/2022  Allergen Reaction Noted   Amoxil [amoxicillin]  07/24/2014   Review of Systems:  Review of Systems  Respiratory:  Negative for shortness of breath.   Cardiovascular:  Negative for chest pain.  Gastrointestinal:  Negative for abdominal pain, constipation, diarrhea, nausea and vomiting.    OBJECTIVE:   Temp:  [97.5 F (36.4 C)-98.3 F (36.8 C)] 98.1 F (36.7 C) (10/16 0556) Pulse Rate:  [73-88] 88 (10/16 0556) Resp:  [14-18] 14 (10/16 0556) BP: (102-121)/(50-58) 102/55 (10/16 0556) SpO2:  [100 %] 100 % (10/16 0556) Last BM Date : 11/24/22 Physical Exam Constitutional:       General: He is not in acute distress.    Appearance: He is not ill-appearing, toxic-appearing or diaphoretic.  Cardiovascular:     Rate and Rhythm: Normal rate and regular rhythm.  Pulmonary:     Effort: No respiratory distress.     Breath sounds: Normal breath sounds.  Abdominal:     General: Bowel sounds are normal. There is no distension.     Palpations: Abdomen is soft.     Tenderness: There is no abdominal tenderness. There is no guarding.  Musculoskeletal:     Right lower leg: No edema.     Left lower leg: No edema.  Neurological:     Mental Status: He is alert.     Labs: Recent Labs    11/24/22 1720 11/25/22 0539 11/25/22 1505  WBC 5.0 4.9  --   HGB 5.8* 6.7* 8.2*  HCT 22.9* 25.1* 30.1*  PLT 292 268  --    BMET Recent Labs    11/24/22 1730 11/25/22 0539  NA 138 138  K 3.5 3.8  CL 104 104  CO2 24 27  GLUCOSE 106* 103*  BUN 14 13  CREATININE 0.89 0.83  CALCIUM 8.9 8.8*   LFT Recent Labs    11/24/22 1730  PROT 7.2  ALBUMIN 4.7  AST 13*  ALT 8  ALKPHOS 39  BILITOT 0.7   PT/INR No results for input(s): "LABPROT", "INR" in the last 72 hours. Diagnostic imaging: CT CHEST ABDOMEN PELVIS W CONTRAST  Result Date: 11/25/2022 CLINICAL DATA:  40 lb weight loss, anemia, loss of appetite. Evaluate for occult malignancy. EXAM: CT CHEST, ABDOMEN, AND PELVIS WITH CONTRAST TECHNIQUE: Multidetector CT imaging of the chest, abdomen and pelvis was performed following the standard protocol during bolus administration of intravenous contrast. RADIATION DOSE REDUCTION: This exam was performed according to the departmental dose-optimization program which includes automated exposure control, adjustment of the mA and/or kV according to patient size and/or use of iterative reconstruction technique. CONTRAST:  OMNIPAQUE IOHEXOL 300 MG/ML  SOLN COMPARISON:  None Available. FINDINGS: CT CHEST FINDINGS Cardiovascular: Heart is normal size. Aorta is normal caliber.  Mediastinum/Nodes: No mediastinal, hilar, or axillary adenopathy. Trachea and esophagus are unremarkable. Thyroid unremarkable. Lungs/Pleura: Lungs are clear. No focal airspace opacities or suspicious nodules. No effusions. Musculoskeletal: Chest wall soft tissues are unremarkable. No acute bony abnormality. CT ABDOMEN PELVIS FINDINGS Hepatobiliary: No focal hepatic abnormality. Gallbladder unremarkable. Pancreas: No focal abnormality or ductal dilatation. Spleen: No focal abnormality.  Normal size. Adrenals/Urinary Tract: No adrenal abnormality. No focal renal abnormality. No stones or hydronephrosis. Urinary bladder is unremarkable. Stomach/Bowel: Stomach, large and small bowel grossly unremarkable. Normal appendix. Vascular/Lymphatic: No evidence of aneurysm or adenopathy. Reproductive: No visible focal abnormality. Other: No free fluid or free air. Musculoskeletal: No acute bony abnormality. IMPRESSION: No acute findings or significant abnormality in the chest, abdomen or pelvis. Electronically Signed   By: Charlett Nose M.D.   On: 11/25/2022 20:22    IMPRESSION: Iron deficiency anemia             -Fecal occult negative stool Unintentional weight loss Nausea, intermittent emesis, no current  PLAN: -Clear liquid diet today, bowel preparation this afternoon, n.p.o. at midnight for EGD and colonoscopy tomorrow -Trend H/H, transfuse for hemoglobin less than 7 -Further recommendations to follow pending procedures tomorrow   LOS: 1 day   Liliane Shi, [facility]

## 2022-11-26 NOTE — Plan of Care (Signed)
  Problem: Education: Goal: Knowledge of General Education information will improve Description: Including pain rating scale, medication(s)/side effects and non-pharmacologic comfort measures Outcome: Progressing   Problem: Health Behavior/Discharge Planning: Goal: Ability to manage health-related needs will improve Outcome: Progressing   Problem: Clinical Measurements: Goal: Ability to maintain clinical measurements within normal limits will improve Outcome: Progressing Goal: Will remain free from infection Outcome: Progressing Goal: Diagnostic test results will improve Outcome: Progressing Goal: Respiratory complications will improve Outcome: Progressing Goal: Cardiovascular complication will be avoided Outcome: Progressing   Problem: Activity: Goal: Risk for activity intolerance will decrease Outcome: Progressing   Problem: Nutrition: Goal: Adequate nutrition will be maintained 11/26/2022 0440 by Elbert Ewings, RN Outcome: Progressing 11/26/2022 0438 by Elbert Ewings, RN Outcome: Progressing   Problem: Coping: Goal: Level of anxiety will decrease 11/26/2022 0440 by Elbert Ewings, RN Outcome: Progressing 11/26/2022 0438 by Elbert Ewings, RN Outcome: Progressing   Problem: Elimination: Goal: Will not experience complications related to bowel motility 11/26/2022 0440 by Elbert Ewings, RN Outcome: Progressing 11/26/2022 0438 by Elbert Ewings, RN Outcome: Progressing Goal: Will not experience complications related to urinary retention 11/26/2022 0440 by Elbert Ewings, RN Outcome: Progressing 11/26/2022 0438 by Elbert Ewings, RN Outcome: Progressing   Problem: Pain Managment: Goal: General experience of comfort will improve 11/26/2022 0440 by Elbert Ewings, RN Outcome: Progressing 11/26/2022 0438 by Elbert Ewings, RN Outcome: Progressing   Problem: Safety: Goal: Ability to remain free from injury will improve 11/26/2022 0440 by  Elbert Ewings, RN Outcome: Progressing 11/26/2022 0438 by Elbert Ewings, RN Outcome: Progressing   Problem: Skin Integrity: Goal: Risk for impaired skin integrity will decrease 11/26/2022 0440 by Elbert Ewings, RN Outcome: Progressing 11/26/2022 0438 by Elbert Ewings, RN Outcome: Progressing

## 2022-11-26 NOTE — Assessment & Plan Note (Signed)
See assessment and plan above 

## 2022-11-26 NOTE — Assessment & Plan Note (Signed)
Minimal symptoms suggests chronic development of severe anemia Hemoglobin has improved substantially, up to most recent value of 8.0 from 5.8 on arrival after only 1 unit of packed red blood cells transfused. Continuing monitor hemoglobin and hematocrit with serial CBCs. [facility] following, their input is appreciated Patient to undergo upper and lower endoscopic workup on 10/17 Bowel prep and clear liquid diet today with n.p.o. after midnight.

## 2022-11-27 ENCOUNTER — Encounter (HOSPITAL_COMMUNITY)
Admission: EM | Disposition: A | Discharge status: Home / Self Care | Payer: Self-pay | Source: Home / Self Care | Attending: Internal Medicine

## 2022-11-27 ENCOUNTER — Inpatient Hospital Stay (HOSPITAL_COMMUNITY): Payer: Medicaid Other | Admitting: Anesthesiology

## 2022-11-27 ENCOUNTER — Other Ambulatory Visit (HOSPITAL_COMMUNITY): Payer: Self-pay

## 2022-11-27 ENCOUNTER — Encounter (HOSPITAL_COMMUNITY): Payer: Self-pay | Admitting: Internal Medicine

## 2022-11-27 DIAGNOSIS — K2970 Gastritis, unspecified, without bleeding: Secondary | ICD-10-CM

## 2022-11-27 DIAGNOSIS — D509 Iron deficiency anemia, unspecified: Secondary | ICD-10-CM

## 2022-11-27 DIAGNOSIS — K3189 Other diseases of stomach and duodenum: Secondary | ICD-10-CM

## 2022-11-27 HISTORY — PX: BIOPSY: SHX5522

## 2022-11-27 HISTORY — PX: ESOPHAGOGASTRODUODENOSCOPY: SHX5428

## 2022-11-27 HISTORY — PX: COLONOSCOPY: SHX5424

## 2022-11-27 LAB — CBC WITH DIFFERENTIAL/PLATELET
Abs Immature Granulocytes: 0.01 10*3/uL (ref 0.00–0.07)
Basophils Absolute: 0 10*3/uL (ref 0.0–0.1)
Basophils Relative: 0 %
Eosinophils Absolute: 0.1 10*3/uL (ref 0.0–0.5)
Eosinophils Relative: 3 %
HCT: 28.9 % — ABNORMAL LOW (ref 39.0–52.0)
Hemoglobin: 7.8 g/dL — ABNORMAL LOW (ref 13.0–17.0)
Immature Granulocytes: 0 %
Lymphocytes Relative: 29 %
Lymphs Abs: 1.4 10*3/uL (ref 0.7–4.0)
MCH: 19.4 pg — ABNORMAL LOW (ref 26.0–34.0)
MCHC: 27 g/dL — ABNORMAL LOW (ref 30.0–36.0)
MCV: 71.7 fL — ABNORMAL LOW (ref 80.0–100.0)
Monocytes Absolute: 0.4 10*3/uL (ref 0.1–1.0)
Monocytes Relative: 9 %
Neutro Abs: 2.8 10*3/uL (ref 1.7–7.7)
Neutrophils Relative %: 59 %
Platelets: 251 10*3/uL (ref 150–400)
RBC: 4.03 MIL/uL — ABNORMAL LOW (ref 4.22–5.81)
RDW: 25.3 % — ABNORMAL HIGH (ref 11.5–15.5)
WBC: 4.7 10*3/uL (ref 4.0–10.5)
nRBC: 0 % (ref 0.0–0.2)

## 2022-11-27 LAB — RENAL FUNCTION PANEL
Albumin: 4.1 g/dL (ref 3.5–5.0)
Anion gap: 12 (ref 5–15)
BUN: 17 mg/dL (ref 6–20)
CO2: 24 mmol/L (ref 22–32)
Calcium: 9.2 mg/dL (ref 8.9–10.3)
Chloride: 105 mmol/L (ref 98–111)
Creatinine, Ser: 0.98 mg/dL (ref 0.61–1.24)
GFR, Estimated: >60 mL/min (ref >60)
Glucose, Bld: 91 mg/dL (ref 70–99)
Phosphorus: 5.3 mg/dL — ABNORMAL HIGH (ref 2.5–4.6)
Potassium: 3.9 mmol/L (ref 3.5–5.1)
Sodium: 141 mmol/L (ref 135–145)

## 2022-11-27 LAB — TECHNOLOGIST SMEAR REVIEW: Plt Morphology: NORMAL

## 2022-11-27 SURGERY — EGD (ESOPHAGOGASTRODUODENOSCOPY)
Anesthesia: Monitor Anesthesia Care

## 2022-11-27 MED ORDER — EPHEDRINE SULFATE-NACL 50-0.9 MG/10ML-% IV SOSY
PREFILLED_SYRINGE | INTRAVENOUS | Status: DC | PRN
Start: 2022-11-27 — End: 2022-11-27
  Administered 2022-11-27: 5 mg via INTRAVENOUS

## 2022-11-27 MED ORDER — PROPOFOL 500 MG/50ML IV EMUL
INTRAVENOUS | Status: DC | PRN
  Administered 2022-11-27: 100 mg via INTRAVENOUS
  Administered 2022-11-27: 225 ug/kg/min via INTRAVENOUS
  Administered 2022-11-27 (×2): 50 mg via INTRAVENOUS

## 2022-11-27 MED ORDER — FENTANYL CITRATE (PF) 100 MCG/2ML IJ SOLN
INTRAMUSCULAR | Status: AC
  Filled 2022-11-27: qty 2

## 2022-11-27 MED ORDER — METRONIDAZOLE 500 MG PO TABS
500.0000 mg | ORAL_TABLET | Freq: Three times a day (TID) | ORAL | Status: DC
  Administered 2022-11-27: 500 mg via ORAL
  Filled 2022-11-27: qty 1

## 2022-11-27 MED ORDER — BISACODYL 10 MG RE SUPP
10.0000 mg | Freq: Once | RECTAL | Status: AC
  Administered 2022-11-27: 10 mg via RECTAL
  Filled 2022-11-27: qty 1

## 2022-11-27 MED ORDER — FERROUS SULFATE 325 (65 FE) MG PO TABS
325.0000 mg | ORAL_TABLET | Freq: Two times a day (BID) | ORAL | 1 refills | Status: AC
Start: 2022-11-27 — End: ?
  Filled 2022-11-27: qty 60, 30d supply, fill #0
  Filled 2023-01-05: qty 60, 30d supply, fill #1

## 2022-11-27 MED ORDER — SODIUM CHLORIDE 0.9 % IV SOLN
INTRAVENOUS | Status: AC | PRN
Start: 2022-11-27 — End: 2022-11-27
  Administered 2022-11-27: 500 mL via INTRAMUSCULAR

## 2022-11-27 MED ORDER — LIDOCAINE HCL (CARDIAC) PF 100 MG/5ML IV SOSY
PREFILLED_SYRINGE | INTRAVENOUS | Status: DC | PRN
Start: 2022-11-27 — End: 2022-11-27
  Administered 2022-11-27: 100 mg via INTRATRACHEAL

## 2022-11-27 MED ORDER — PHENYLEPHRINE HCL (PRESSORS) 10 MG/ML IV SOLN
INTRAVENOUS | Status: DC | PRN
Start: 2022-11-27 — End: 2022-11-27
  Administered 2022-11-27: 80 ug via INTRAVENOUS
  Administered 2022-11-27: 160 ug via INTRAVENOUS

## 2022-11-27 MED ORDER — FERROUS SULFATE 325 (65 FE) MG PO TABS
325.0000 mg | ORAL_TABLET | Freq: Two times a day (BID) | ORAL | Status: DC
  Administered 2022-11-27: 325 mg via ORAL
  Filled 2022-11-27: qty 1

## 2022-11-27 MED ORDER — METRONIDAZOLE 500 MG PO TABS
500.0000 mg | ORAL_TABLET | Freq: Three times a day (TID) | ORAL | 0 refills | Status: AC
  Filled 2022-11-27: qty 21, 7d supply, fill #0

## 2022-11-27 NOTE — Discharge Summary (Signed)
Physician Discharge Summary   Patient: Shane Fisher MRN: [id number] DOB: [birth year]  Admit date:     11/24/2022  Discharge date: 11/27/22  Discharge Physician: Marinda Elk   PCP: Berline Lopes, MD   Recommendations at discharge:   Please take all prescribed medications exactly as instructed including your ferrous sulfate (iron) and metronidazole. When taking your iron tablets, please attempt to take them 30 minutes before a meal twice daily on an essentially empty stomach.  When taking them, they can be best absorbed when taken with a small glass of orange juice. Metronidazole you are currently receiving [place] to treat you for any possible parasitic infections in your intestine that may be causing you to be anemic.  Please make sure you take this entire course of medication for to be effective. The stool sample will be collected from you before you leave the hospital.  Please follow-up with your primary care provider to follow-up on the results of this test.  We are treating you with a course of antibiotics anyway in case this test is positive. We are also sending off a blood test to test you for certain blood disorder called thalassemia.  Please follow-up with your outpatient provider to determine the results of this test. Please follow with both your primary care provider in 1 to 2 as well as Dr. Lorenso Quarry with gastroenterology in approximately 4 weeks. If you develop any evidence of blood in your stool, black stool, worsening weakness, chest pain, shortness of breath or loss of consciousness return to the emergency department. Please avoid alcohol or high doses of anti-inflammatory medications such as Advil, Aleve, Motrin, ibuprofen or naproxen.  Discharge Diagnoses: Principal Problem:   Chronic blood loss anemia Active Problems:   Symptomatic anemia   Iron deficiency anemia due to chronic blood loss  Resolved Problems:   * No resolved hospital problems. *   Hospital  Course: Shane Fisher is a 22 y.o. male with no significant medical history who is admitted with complaints of fatigue and lightheadedness found to have a hemoglobin of 5.9 by their primary care provider sent to [facility] hospital emergency department for further evaluation.  Upon evaluation in the emergency department confirmation blood testing revealed a hemoglobin of 5.8.  Patient was hemodynamically stable.  Patient was accepted for transfer to [facility] for further medical care.  Upon arrival to [facility] 1 unit packed blood cell transfusion was initiated.  Secure chat consultation request was sent to gastroenterology to request their assistance.  Patient exhibited no evidence of gross bleeding during the hospitalization with appropriate increase in hemoglobin status post transfusion.  Anemia workup revealed severe microcytosis with severe iron deficiency with normal vitamin B12 and folate levels.  Gastroenterology decided to proceed with both upper endoscopy and colonoscopy on 11/27/2022.  Colonoscopy was found to be unremarkable.  Upper endoscopy revealed some gastritis which was biopsied.  Additionally, there was some villous blunting noted in the duodenum.  There is no obvious evidence of source of bleeding or evidence of malignancy.  Gastroenterology recommended ova and parasite testing.  After the procedure, ova and parasite testing was obtained with the intent to follow-up on the results as an outpatient.  Because of the severe microcytosis and the possibility of thalassemia hemoglobin electrophoresis blood testing was obtained prior to discharge.    At time of discharge, patient was empirically initiated on metronidazole 500 mg 3 times daily for a total of 7 days.  Patient was additionally placed on  ferrous sulfate 300.5 mg twice daily.  The patient was instructed to follow-up as an outpatient with both his primary care provider to follow-up on ova/parasite testing and  thalassemia testing in 1 to 2 weeks as well as Dr. Lorenso Quarry in approximately 4 weeks.   Consultants: Dr. Lorenso Quarry with Gastroenterology Procedures performed: Upper Endoscopy and Colonsocopy performed by Dr. Lorenso Quarry on 11/27/2022.  Disposition: Home Diet recommendation:  Discharge Diet Orders (From admission, onward)     Start     Ordered   11/27/22 0000  Diet general        11/27/22 1416           Regular diet  DISCHARGE MEDICATION: Allergies as of 11/27/2022       Reactions   Amoxil [amoxicillin]         Medication List     TAKE these medications    ferrous sulfate 325 (65 FE) MG tablet Take 1 tablet (325 mg total) by mouth 2 (two) times daily before a meal.   metroNIDAZOLE 500 MG tablet Commonly known as: FLAGYL Take 1 tablet (500 mg total) by mouth every 8 (eight) hours for 7 days.        Follow-up Information     Berline Lopes, MD. Schedule an appointment as soon as possible for a visit in 1 week(s).   Specialty: Pediatrics Contact information: [address] [place] [address] [phone]         Liliane Shi H, DO Follow up in 4 week(s).   Specialty: Gastroenterology Contact information: [address] [place] [address] [phone]                 Discharge Exam: Ceasar Mons Weights   11/24/22 1722  Weight: 76.2 kg   Constitutional: Awake alert and oriented x3, no associated distress.   Respiratory: clear to auscultation bilaterally, no wheezing, no crackles. Normal respiratory effort. No accessory muscle use.  Cardiovascular: Regular rate and rhythm, no murmurs / rubs / gallops. No extremity edema. 2+ pedal pulses. No carotid bruits.  Abdomen: Abdomen is soft with mild periumbilical tenderness.  No evidence of intra-abdominal masses.  Positive bowel sounds noted in all quadrants.   Musculoskeletal: No joint deformity upper and lower extremities. Good ROM, no contractures. Normal muscle tone.      Condition at discharge: fair  The results of significant diagnostics from this hospitalization (including imaging, microbiology, ancillary and laboratory) are listed below for reference.   Imaging Studies: CT CHEST ABDOMEN PELVIS W CONTRAST  Result Date: 11/25/2022 CLINICAL DATA:  40 lb weight loss, anemia, loss of appetite. Evaluate for occult malignancy. EXAM: CT CHEST, ABDOMEN, AND PELVIS WITH CONTRAST TECHNIQUE: Multidetector CT imaging of the chest, abdomen and pelvis was performed following the standard protocol during bolus administration of intravenous contrast. RADIATION DOSE REDUCTION: This exam was performed according to the departmental dose-optimization program which includes automated exposure control, adjustment of the mA and/or kV according to patient size and/or use of iterative reconstruction technique. CONTRAST:  OMNIPAQUE IOHEXOL 300 MG/ML  SOLN COMPARISON:  None Available. FINDINGS: CT CHEST FINDINGS Cardiovascular: Heart is normal size. Aorta is normal caliber. Mediastinum/Nodes: No mediastinal, hilar, or axillary adenopathy. Trachea and esophagus are unremarkable. Thyroid unremarkable. Lungs/Pleura: Lungs are clear. No focal airspace opacities or suspicious nodules. No effusions. Musculoskeletal: Chest wall soft tissues are unremarkable. No acute bony abnormality. CT ABDOMEN PELVIS FINDINGS Hepatobiliary: No focal hepatic abnormality. Gallbladder unremarkable. Pancreas: No focal abnormality or ductal dilatation. Spleen:  No focal abnormality.  Normal size. Adrenals/Urinary Tract: No adrenal abnormality. No focal renal abnormality. No stones or hydronephrosis. Urinary bladder is unremarkable. Stomach/Bowel: Stomach, large and small bowel grossly unremarkable. Normal appendix. Vascular/Lymphatic: No evidence of aneurysm or adenopathy. Reproductive: No visible focal abnormality. Other: No free fluid or free air. Musculoskeletal: No acute bony abnormality. IMPRESSION: No acute  findings or significant abnormality in the chest, abdomen or pelvis. Electronically Signed   By: Charlett Nose M.D.   On: 11/25/2022 20:22    Microbiology: No results found for this or any previous visit.  Labs: CBC: Recent Labs  Lab 11/24/22 1720 11/25/22 0539 11/25/22 1505 11/26/22 1346 11/27/22 0532  WBC 5.0 4.9  --  4.0 4.7  NEUTROABS 3.3  --   --   --  2.8  HGB 5.8* 6.7* 8.2* 8.0* 7.8*  HCT 22.9* 25.1* 30.1* 29.7* 28.9*  MCV 64.0* 68.2*  --  70.7* 71.7*  PLT 292 268  --  262 251   Basic Metabolic Panel: Recent Labs  Lab 11/24/22 1730 11/25/22 0539 11/27/22 0532  NA 138 138 141  K 3.5 3.8 3.9  CL 104 104 105  CO2 24 27 24   GLUCOSE 106* 103* 91  BUN 14 13 17   CREATININE 0.89 0.83 0.98  CALCIUM 8.9 8.8* 9.2  PHOS  --   --  5.3*   Liver Function Tests: Recent Labs  Lab 11/24/22 1730 11/27/22 0532  AST 13*  --   ALT 8  --   ALKPHOS 39  --   BILITOT 0.7  --   PROT 7.2  --   ALBUMIN 4.7 4.1   CBG: No results for input(s): "GLUCAP" in the last 168 hours.  Discharge time spent: greater than 30 minutes.  Signed: Marinda Elk, MD [facility] 11/27/2022

## 2022-11-27 NOTE — Plan of Care (Signed)
  Problem: Education: Goal: Knowledge of General Education information will improve Description: Including pain rating scale, medication(s)/side effects and non-pharmacologic comfort measures Outcome: Adequate for Discharge   Problem: Health Behavior/Discharge Planning: Goal: Ability to manage health-related needs will improve Outcome: Adequate for Discharge   Problem: Clinical Measurements: Goal: Ability to maintain clinical measurements within normal limits will improve Outcome: Adequate for Discharge Goal: Will remain free from infection Outcome: Adequate for Discharge Goal: Diagnostic test results will improve Outcome: Adequate for Discharge Goal: Respiratory complications will improve Outcome: Adequate for Discharge Goal: Cardiovascular complication will be avoided Outcome: Adequate for Discharge   Problem: Activity: Goal: Risk for activity intolerance will decrease Outcome: Adequate for Discharge   Problem: Nutrition: Goal: Adequate nutrition will be maintained Outcome: Adequate for Discharge   Problem: Coping: Goal: Level of anxiety will decrease Outcome: Adequate for Discharge   Problem: Elimination: Goal: Will not experience complications related to bowel motility Outcome: Adequate for Discharge Goal: Will not experience complications related to urinary retention Outcome: Adequate for Discharge   Problem: Pain Managment: Goal: General experience of comfort will improve Outcome: Adequate for Discharge   Problem: Safety: Goal: Ability to remain free from injury will improve Outcome: Adequate for Discharge   Problem: Skin Integrity: Goal: Risk for impaired skin integrity will decrease Outcome: Adequate for Discharge   Problem: Education: Goal: Understanding of post-operative needs will improve Outcome: Adequate for Discharge Goal: Individualized Educational Video(s) Outcome: Adequate for Discharge   Problem: Clinical Measurements: Goal: Postoperative  complications will be avoided or minimized Outcome: Adequate for Discharge   Problem: Respiratory: Goal: Will regain and/or maintain adequate ventilation Outcome: Adequate for Discharge

## 2022-11-27 NOTE — Discharge Instructions (Signed)
Please take all prescribed medications exactly as instructed including your ferrous sulfate (iron) and metronidazole. When taking your iron tablets, please attempt to take them 30 minutes before a meal twice daily on an essentially empty stomach.  When taking them, they can be best absorbed when taken with a small glass of orange juice. Metronidazole you are currently receiving [place] to treat you for any possible parasitic infections in your intestine that may be causing you to be anemic.  Please make sure you take this entire course of medication for to be effective. The stool sample will be collected from you before you leave the hospital.  Please follow-up with your primary care provider to follow-up on the results of this test.  We are treating you with a course of antibiotics anyway in case this test is positive. We are also sending off a blood test to test you for certain blood disorder called thalassemia.  Please follow-up with your outpatient provider to determine the results of this test. Please follow with both your primary care provider in 1 to 2 as well as Dr. Lorenso Quarry with gastroenterology in approximately 4 weeks. If you develop any evidence of blood in your stool, black stool, worsening weakness, chest pain, shortness of breath or loss of consciousness return to the emergency department. Please avoid alcohol or high doses of anti-inflammatory medications such as Advil, Aleve, Motrin, ibuprofen or naproxen.

## 2022-11-27 NOTE — Plan of Care (Signed)
Pt D/C home after BM sent to lab. Removed IV, pt belongings returned. AVS reviewed follow up questions answered.  BP (!) 114/56 (BP Location: Left Arm)   Pulse 63   Temp (!) 97.4 F (36.3 C) (Oral)   Resp 16   Ht 5\' 7"  (1.702 m)   Wt 76.2 kg   SpO2 100%   BMI 26.31 kg/m   Pt taken off the floor by staff.  Reva Bores 11/27/22 7:27 PM

## 2022-11-27 NOTE — Transfer of Care (Signed)
Immediate Anesthesia Transfer of Care Note  Patient: Shane Fisher  Procedure(s) Performed: ESOPHAGOGASTRODUODENOSCOPY (EGD) COLONOSCOPY BIOPSY  Patient Location: PACU  Anesthesia Type:MAC  Level of Consciousness: sedated  Airway & Oxygen Therapy: Patient Spontanous Breathing  Post-op Assessment: Report given to RN  Post vital signs: Reviewed and stable  Last Vitals:  Vitals Value Taken Time  BP 102/30 11/27/22 1252  Temp 36.7 C 11/27/22 1247  Pulse 95 11/27/22 1253  Resp 15 11/27/22 1253  SpO2 99 % 11/27/22 1253  Vitals shown include unfiled device data.  Last Pain:  Vitals:   11/27/22 1247  TempSrc: Temporal  PainSc: 0-No pain         Complications: No notable events documented.

## 2022-11-27 NOTE — Anesthesia Postprocedure Evaluation (Signed)
Anesthesia Post Note  Patient: Shane Fisher  Procedure(s) Performed: ESOPHAGOGASTRODUODENOSCOPY (EGD) COLONOSCOPY BIOPSY     Patient location during evaluation: PACU Anesthesia Type: MAC Level of consciousness: awake and alert Pain management: pain level controlled Vital Signs Assessment: post-procedure vital signs reviewed and stable Respiratory status: spontaneous breathing, nonlabored ventilation and respiratory function stable Cardiovascular status: blood pressure returned to baseline and stable Postop Assessment: no apparent nausea or vomiting Anesthetic complications: no   No notable events documented.  Last Vitals:  Vitals:   11/27/22 1330 11/27/22 1411  BP: (!) 112/57 (!) 114/56  Pulse: 72 63  Resp: 14 16  Temp:  (!) 36.3 C  SpO2: 100% 100%    Last Pain:  Vitals:   11/27/22 1411  TempSrc: Oral  PainSc:                  Lowella Curb

## 2022-11-27 NOTE — Interval H&P Note (Signed)
History and Physical Interval Note:  11/27/2022 11:27 AM  Shane Fisher  has presented today for surgery, with the diagnosis of Unintentional weight loss, profound anemia.  The various methods of treatment have been discussed with the patient and family. After consideration of risks, benefits and other options for treatment, the patient has consented to  Procedure(s): ESOPHAGOGASTRODUODENOSCOPY (EGD) (N/A) COLONOSCOPY (N/A) as a surgical intervention.  The patient's history has been reviewed, patient examined, no change in status, stable for surgery.  I have reviewed the patient's chart and labs.  Questions were answered to the patient's satisfaction.     Lynann Bologna

## 2022-11-27 NOTE — Anesthesia Preprocedure Evaluation (Signed)
Anesthesia Evaluation  Patient identified by MRN, date of birth, ID band Patient awake    Reviewed: Allergy & Precautions, H&P , NPO status , Patient's Chart, lab work & pertinent test results  Airway Mallampati: II  TM Distance: >3 FB Neck ROM: Full    Dental no notable dental hx.    Pulmonary neg pulmonary ROS   Pulmonary exam normal breath sounds clear to auscultation       Cardiovascular negative cardio ROS Normal cardiovascular exam Rhythm:Regular Rate:Normal     Neuro/Psych negative neurological ROS  negative psych ROS   GI/Hepatic negative GI ROS, Neg liver ROS,,,  Endo/Other  negative endocrine ROS    Renal/GU negative Renal ROS  negative genitourinary   Musculoskeletal negative musculoskeletal ROS (+)    Abdominal   Peds negative pediatric ROS (+)  Hematology  (+) Blood dyscrasia, anemia   Anesthesia Other Findings   Reproductive/Obstetrics negative OB ROS                             Anesthesia Physical Anesthesia Plan  ASA: 2  Anesthesia Plan: MAC   Post-op Pain Management: Minimal or no pain anticipated   Induction: Intravenous  PONV Risk Score and Plan: 1 and Propofol infusion and Treatment may vary due to age or medical condition  Airway Management Planned: Nasal Cannula  Additional Equipment:   Intra-op Plan:   Post-operative Plan:   Informed Consent: I have reviewed the patients History and Physical, chart, labs and discussed the procedure including the risks, benefits and alternatives for the proposed anesthesia with the patient or authorized representative who has indicated his/her understanding and acceptance.     Dental advisory given  Plan Discussed with: CRNA  Anesthesia Plan Comments:        Anesthesia Quick Evaluation

## 2022-11-27 NOTE — Op Note (Addendum)
[facility] Patient Name: Shane Fisher Procedure Date: 11/27/2022 MRN: [id number] Attending MD: Liliane Shi DO, DO, [id number] Date of Birth: [birth year] CSN: [id number] Age: 22 Admit Type: Inpatient Procedure:                Upper GI endoscopy Indications:              Iron deficiency anemia, Weight loss Providers:                Liliane Shi DO, DO, Zoe Lan, RN, Brion Aliment, Technician Referring MD:              Medicines:                See the Anesthesia note for documentation of the                            administered medications Complications:            No immediate complications. Estimated Blood Loss:     Estimated blood loss was minimal. Procedure:                Pre-Anesthesia Assessment:                           - ASA Grade Assessment: II - A patient with mild                            systemic disease.                           - The risks and benefits of the procedure and the                            sedation options and risks were discussed with the                            patient. All questions were answered and informed                            consent was obtained.                           After obtaining informed consent, the endoscope was                            passed under direct vision. Throughout the                            procedure, the patient's blood pressure, pulse, and                            oxygen saturations were monitored continuously. The                            [id number]-[id number] ([id number]) Olympus endoscope was  introduced                            through the mouth, and advanced to the second part                            of duodenum. The upper GI endoscopy was                            accomplished without difficulty. The patient                            tolerated the procedure well. Scope In: Scope Out: Findings:      The Z-line was regular and was found 40 cm from  the incisors.      Scattered mild inflammation characterized by adherent blood, erythema       and friability was found in the entire examined stomach. Biopsies were       taken with a cold forceps for Helicobacter pylori testing.      Villous appearing mucosa was found in the duodenal bulb. Biopsies were       taken with a cold forceps for histology. Impression:               - Z-line regular, 40 cm from the incisors.                           - Gastritis. Biopsied.                           - Mucosa showed villous blunting in the duodenal                            bulb. Biopsied. Moderate Sedation:      See the other procedure note for documentation of moderate sedation with       intraservice time. Recommendation:           - Await pathology results.                           - Check ova/parasite stool testing.                           - Continue present medications.                           - Resume regular diet.                           - Return to my office in 1 month.                           - Return patient to hospital ward for possible                            discharge same day. Procedure Code(s):        --- Professional ---  95621, Esophagogastroduodenoscopy, flexible,                            transoral; with biopsy, single or multiple Diagnosis Code(s):        --- Professional ---                           K29.70, Gastritis, unspecified, without bleeding                           K31.89, Other diseases of stomach and duodenum                           D50.9, Iron deficiency anemia, unspecified                           R63.4, Abnormal weight loss CPT copyright 2022 [employer]. All rights reserved. The codes documented in this report are preliminary and upon coder review may  be revised to meet current compliance requirements. Dr Liliane Shi, DO Liliane Shi DO, DO 11/27/2022 1:09:25 PM Number of Addenda: 0

## 2022-11-27 NOTE — Op Note (Signed)
[facility] Patient Name: Shane Fisher Procedure Date: 11/27/2022 MRN: [id number] Attending MD: Liliane Shi DO, DO, [id number] Date of Birth: 21-Sep-2000 CSN: [id number] Age: 22 Admit Type: Outpatient Procedure:                Colonoscopy Indications:              Iron deficiency anemia, Weight loss Providers:                Liliane Shi DO, DO, Zoe Lan, RN, Brion Aliment, Technician Referring MD:              Medicines:                See the Anesthesia note for documentation of the                            administered medications Complications:            No immediate complications. Estimated Blood Loss:     Estimated blood loss: none. Procedure:                Pre-Anesthesia Assessment:                           - ASA Grade Assessment: II - A patient with mild                            systemic disease.                           - The risks and benefits of the procedure and the                            sedation options and risks were discussed with the                            patient. All questions were answered and informed                            consent was obtained.                           After obtaining informed consent, the colonoscope                            was passed under direct vision. Throughout the                            procedure, the patient's blood pressure, pulse, and                            oxygen saturations were monitored continuously. The                            [id number]-HQ190L ([id number]) Olympus colonoscope was  introduced through the anus and advanced to the the                            terminal ileum, with identification of the                            appendiceal orifice and IC valve. The colonoscopy                            was performed without difficulty. The patient                            tolerated the procedure well. The quality of the                             bowel preparation was evaluated using the BBPS                            [facility] Bowel Preparation Scale) with scores of:                            Right Colon = 2 (minor amount of residual staining,                            small fragments of stool and/or opaque liquid, but                            mucosa seen well), Transverse Colon = 3 (entire                            mucosa seen well with no residual staining, small                            fragments of stool or opaque liquid) and Left Colon                            = 3 (entire mucosa seen well with no residual                            staining, small fragments of stool or opaque                            liquid). The total BBPS score equals 8. The quality                            of the bowel preparation was good. Scope In: 12:24:31 PM Scope Out: 12:41:25 PM Scope Withdrawal Time: 0 hours 11 minutes 36 seconds  Total Procedure Duration: 0 hours 16 minutes 54 seconds  Findings:      The perianal and digital rectal examinations were normal.      There is no endoscopic evidence of diverticula, inflammation, polyps,       stenosis or ulcerations in the entire colon.  The terminal ileum appeared normal. Impression:               - The examined portion of the ileum was normal.                           - No specimens collected. Moderate Sedation:      See the other procedure note for documentation of moderate sedation with       intraservice time. Recommendation:           - Return patient to hospital ward for possible                            discharge same day.                           - Resume regular diet.                           - Continue present medications.                           - Return to my office in 1 month. Procedure Code(s):        --- Professional ---                           [phone], Colonoscopy, flexible; diagnostic, including                            collection of  specimen(s) by brushing or washing,                            when performed (separate procedure) Diagnosis Code(s):        --- Professional ---                           D50.9, Iron deficiency anemia, unspecified                           R63.4, Abnormal weight loss CPT copyright 2022 [employer]. All rights reserved. The codes documented in this report are preliminary and upon coder review may  be revised to meet current compliance requirements. Dr Liliane Shi, DO Liliane Shi DO, DO 11/27/2022 1:14:24 PM Number of Addenda: 0

## 2022-11-30 ENCOUNTER — Encounter (HOSPITAL_COMMUNITY): Payer: Self-pay | Admitting: Internal Medicine

## 2022-12-01 LAB — SURGICAL PATHOLOGY

## 2022-12-01 LAB — HGB FRACTIONATION CASCADE
Hgb A2: 2 % (ref 1.8–3.2)
Hgb A: 98 % (ref 96.4–98.8)
Hgb F: 0 % (ref 0.0–2.0)
Hgb S: 0 %

## 2022-12-03 LAB — O&P RESULT

## 2022-12-03 LAB — OVA + PARASITE EXAM

## 2023-01-06 ENCOUNTER — Other Ambulatory Visit: Payer: Self-pay
